# Patient Record
Sex: Female | Born: 1995 | Race: Black or African American | Hispanic: No | Marital: Single | Smoking: Never smoker
Health system: Southern US, Community
[De-identification: ages and names within clinical notes are randomized; demographics above are authoritative.]

## PROBLEM LIST (undated history)

## (undated) DIAGNOSIS — I10 Essential (primary) hypertension: Secondary | ICD-10-CM

## (undated) DIAGNOSIS — B9689 Other specified bacterial agents as the cause of diseases classified elsewhere: Secondary | ICD-10-CM

## (undated) DIAGNOSIS — B379 Candidiasis, unspecified: Secondary | ICD-10-CM

---
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
## 2017-02-15 ENCOUNTER — Emergency Department (HOSPITAL_COMMUNITY): Payer: Self-pay

## 2017-02-15 ENCOUNTER — Other Ambulatory Visit: Payer: Self-pay

## 2017-02-15 ENCOUNTER — Emergency Department (HOSPITAL_COMMUNITY)
Admission: EM | Admit: 2017-02-15 | Discharge: 2017-02-16 | Disposition: A | Discharge status: Home / Self Care | Payer: Self-pay | Attending: Emergency Medicine | Admitting: Emergency Medicine

## 2017-02-15 ENCOUNTER — Encounter (HOSPITAL_COMMUNITY): Payer: Self-pay

## 2017-02-15 DIAGNOSIS — Y9389 Activity, other specified: Secondary | ICD-10-CM | POA: Insufficient documentation

## 2017-02-15 DIAGNOSIS — Y999 Unspecified external cause status: Secondary | ICD-10-CM | POA: Insufficient documentation

## 2017-02-15 DIAGNOSIS — Y929 Unspecified place or not applicable: Secondary | ICD-10-CM | POA: Insufficient documentation

## 2017-02-15 DIAGNOSIS — S52501A Unspecified fracture of the lower end of right radius, initial encounter for closed fracture: Secondary | ICD-10-CM | POA: Insufficient documentation

## 2017-02-15 DIAGNOSIS — I10 Essential (primary) hypertension: Secondary | ICD-10-CM | POA: Insufficient documentation

## 2017-02-15 DIAGNOSIS — S52601A Unspecified fracture of lower end of right ulna, initial encounter for closed fracture: Secondary | ICD-10-CM

## 2017-02-15 HISTORY — DX: Essential (primary) hypertension: I10

## 2017-02-15 MED ORDER — LIDOCAINE HCL 1 % IJ SOLN
20.0000 mL | Freq: Once | INTRAMUSCULAR | Status: AC
  Administered 2017-02-16: 20 mL
  Filled 2017-02-15: qty 20

## 2017-02-15 NOTE — ED Notes (Signed)
Transported to xray 

## 2017-02-15 NOTE — ED Notes (Signed)
EMS splint intact to right forearm area
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
## 2017-02-16 ENCOUNTER — Emergency Department (HOSPITAL_COMMUNITY): Payer: Self-pay

## 2017-02-16 MED ORDER — OXYCODONE HCL 5 MG PO TABS: 5.0000 mg | ORAL_TABLET | Freq: Four times a day (QID) | ORAL | 0 refills | Status: DC | PRN

## 2017-02-16 MED ORDER — HYDROCODONE-ACETAMINOPHEN 5-325 MG PO TABS: 1.0000 | ORAL_TABLET | Freq: Four times a day (QID) | ORAL | 0 refills | Status: DC | PRN

## 2017-02-16 MED ORDER — ACETAMINOPHEN 325 MG PO TABS: 650.0000 mg | ORAL_TABLET | Freq: Four times a day (QID) | ORAL | Status: DC | PRN

## 2017-02-16 MED ORDER — IBUPROFEN 200 MG PO TABS: 600.0000 mg | ORAL_TABLET | Freq: Four times a day (QID) | ORAL | Status: DC | PRN
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
## 2017-02-16 NOTE — Progress Notes (Signed)
Orthopedic Tech Progress Note Patient Details:  Jane Curry [birth year] [id number]  Ortho Devices Type of Ortho Device: Ace wrap, Sugartong splint, Arm sling Ortho Device/Splint Location: RUE Ortho Device/Splint Interventions: Ordered, Application   Post Interventions Patient Tolerated: Well Instructions Provided: Care of device   Jennye MoccasinHughes, Jane Curry 02/16/2017, 12:40 AM

## 2017-11-24 ENCOUNTER — Ambulatory Visit (HOSPITAL_COMMUNITY)
Admission: EM | Admit: 2017-11-24 | Discharge: 2017-11-24 | Disposition: A | Discharge status: Home / Self Care | Payer: BLUE CROSS/BLUE SHIELD | Attending: Emergency Medicine | Admitting: Emergency Medicine

## 2017-11-24 ENCOUNTER — Encounter (HOSPITAL_COMMUNITY): Payer: Self-pay

## 2017-11-24 DIAGNOSIS — W57XXXA Bitten or stung by nonvenomous insect and other nonvenomous arthropods, initial encounter: Secondary | ICD-10-CM

## 2017-11-24 DIAGNOSIS — S50861A Insect bite (nonvenomous) of right forearm, initial encounter: Secondary | ICD-10-CM

## 2017-11-24 DIAGNOSIS — M62830 Muscle spasm of back: Secondary | ICD-10-CM

## 2017-11-24 DIAGNOSIS — L270 Generalized skin eruption due to drugs and medicaments taken internally: Secondary | ICD-10-CM | POA: Diagnosis not present

## 2017-11-24 MED ORDER — NAPROXEN 375 MG PO TABS: 375.0000 mg | ORAL_TABLET | Freq: Two times a day (BID) | ORAL | 0 refills | Status: DC

## 2017-11-24 MED ORDER — TRIAMCINOLONE ACETONIDE 0.025 % EX OINT: 1.0000 "application " | TOPICAL_OINTMENT | Freq: Two times a day (BID) | CUTANEOUS | 0 refills | Status: AC

## 2017-11-24 MED ORDER — CYCLOBENZAPRINE HCL 10 MG PO TABS: 10.0000 mg | ORAL_TABLET | Freq: Every day | ORAL | 0 refills | Status: DC

## 2017-11-24 NOTE — Discharge Instructions (Addendum)
Insect bite:  Triamcinolone 0.025% cream prescribed Use as directed for symptomatic relief Return or go to the ER if you have any new or worsening symptoms such as pain, increased redness, fever, chills, nausea, vomiting, joint pain, body aches, abdominal pain, etc...  Back Pain:  Continue conservative management of rest, ice, and gentle stretches Take naproxen as needed for pain relief (may cause abdominal discomfort, ulcers, and GI bleeds avoid taking with other NSAIDs) Take cyclobenzaprine at nighttime for symptomatic relief. Avoid driving or operating heavy machinery while using medication. PCP assistance initiated  Follow up with PCP or with [facility] for further evaluation and management of chronic back pain Present to ER if worsening or new symptoms (fever, chills, chest pain, abdominal pain, changes in bowel or bladder habits, pain radiating into lower legs, etc...)

## 2017-11-24 NOTE — ED Provider Notes (Signed)
[facility]   [id number] 11/24/17 Arrival Time: 1254  CC: Bug bite and back pain  SUBJECTIVE:  Jane Curry is a 22 y.o. female who presents with an insect bite to right forearm that began 2 months ago, but yesterday noticed redness around site.  Denies a precipitating event, but does admit to increasing ibuprofen use recently.  Unsure what type of insect bit her.  Localizes the bite to right forearm.  Describes it as itchy.  Has NOT tried OTC medications.  Symptoms are made worse with friciton.  Denies similar symptoms in the past.  Complains of bruising and erythema. Denies fever, chills, nausea, vomiting, joint pain, SOB, chest pain, abdominal pain, changes in bowel or bladder function.    Patient also incidentally mentions back pain that occurred a year ago.  Denies a precipitating event, but admits to lifting boxes at her previous employer around the time of her symptoms.  Localizes back pain to right low back.  Describes as intermittent, but more persistent recently.  Has tried going to chiropractor and using ibuprofen with minimal relief.  Worse with ROM.  Denies fever, chills, nausea, vomiting, redness, swelling, ecchymosis, numbness, tingling, loss of bowel or bladder function.    ROS: As per HPI.  Past Medical History:  Diagnosis Date  . Hypertension    History reviewed. No pertinent surgical history. No Known Allergies No current facility-administered medications on file prior to encounter.    Current Outpatient Medications on File Prior to Encounter  Medication Sig Dispense Refill  . norethindrone-ethinyl estradiol (JUNEL FE,GILDESS FE,LOESTRIN FE) 1-20 MG-MCG tablet Take 1 tablet by mouth daily.     Social History   Socioeconomic History  . Marital status: Single    Spouse name: Not on file  . Number of children: Not on file  . Years of education: Not on file  . Highest education level: Not on file  Occupational History  . Not on file  Social Needs  .  Financial resource strain: Not on file  . Food insecurity:    Worry: Not on file    Inability: Not on file  . Transportation needs:    Medical: Not on file    Non-medical: Not on file  Tobacco Use  . Smoking status: Never Smoker  . Smokeless tobacco: Never Used  Substance and Sexual Activity  . Alcohol use: No    Frequency: Never  . Drug use: No  . Sexual activity: Not on file  Lifestyle  . Physical activity:    Days per week: Not on file    Minutes per session: Not on file  . Stress: Not on file  Relationships  . Social connections:    Talks on phone: Not on file    Gets together: Not on file    Attends religious service: Not on file    Active member of club or organization: Not on file    Attends meetings of clubs or organizations: Not on file    Relationship status: Not on file  . Intimate partner violence:    Fear of current or ex partner: Not on file    Emotionally abused: Not on file    Physically abused: Not on file    Forced sexual activity: Not on file  Other Topics Concern  . Not on file  Social History Narrative  . Not on file   History reviewed. No pertinent family history.  OBJECTIVE: Vitals:   11/24/17 1311  BP: (!) 147/77  Pulse: 82  Resp: 18  Temp: 98.6 F (37 C)  TempSrc: Oral  SpO2: 100%    General appearance: alert; no distress Lungs: clear to auscultation bilaterally Heart: regular rate and rhythm.  Radial pulse 2+ bilaterally Extremities: no edema MSK: Back  Inspection: Skin clear and intact without obvious erythema, effusion, or ecchymosis.  Skin warm and dry to the touch  Palpation: No midline tenderness, tender to palpation about the right lower back  ROM: FROM active and passive  Strength: 5/5 shld abduction, 5/5 shld adduction, 5/5 elbow flexion, 5/5 elbow extension, 5/5 grip strength, 5/5 hip flexion, 5/5 knee abduction, 5/5 knee adduction, 5/5 knee flexion, 5/5 knee extension, 5/5 dorsiflexion, 5/5 plantar flexion  Sensation  intact about the upper and lower extremities  Skin: warm and dry; ecchymosis approximately 2 cm in diameter with 1 cm surrounding flat erythema localized to right anterior forearm; nontender to palpation; no obvious drainage; ecchymosis does not blanche with pressure Psychological: alert and cooperative; normal mood and affect        ASSESSMENT & PLAN:  1. Drug eruption   2. Back spasm   3. Insect bite of right forearm, initial encounter     Meds ordered this encounter  Medications  . naproxen (NAPROSYN) 375 MG tablet    Sig: Take 1 tablet (375 mg total) by mouth 2 (two) times daily.    Dispense:  20 tablet    Refill:  0    Order Specific Question:   Supervising Provider    Answer:   Isa Rankin [phone]  . cyclobenzaprine (FLEXERIL) 10 MG tablet    Sig: Take 1 tablet (10 mg total) by mouth at bedtime.    Dispense:  15 tablet    Refill:  0    Order Specific Question:   Supervising Provider    Answer:   Isa Rankin [phone]  . triamcinolone (KENALOG) 0.025 % ointment    Sig: Apply 1 application topically 2 (two) times daily.    Dispense:  30 g    Refill:  0    Order Specific Question:   Supervising Provider    Answer:   Isa Rankin [[id number]]   Insect bite:  Triamcinolone 0.025% cream prescribed Use as directed for symptomatic relief Return or go to the ER if you have any new or worsening symptoms such as pain, increased redness, fever, chills, nausea, vomiting, joint pain, body aches, abdominal pain, etc...  Back Pain:  Continue conservative management of rest, ice, and gentle stretches Take naproxen as needed for pain relief (may cause abdominal discomfort, ulcers, and GI bleeds avoid taking with other NSAIDs) Take cyclobenzaprine at nighttime for symptomatic relief. Avoid driving or operating heavy machinery while using medication. PCP assistance initiated  Follow up with PCP or with [facility] and wellness for further evaluation and  management of chronic back pain Present to ER if worsening or new symptoms (fever, chills, chest pain, abdominal pain, changes in bowel or bladder habits, pain radiating into lower legs, etc...)  Reviewed expectations re: course of current medical issues. Questions answered. Outlined signs and symptoms indicating need for more acute intervention. Patient verbalized understanding. After Visit Summary given.   Rennis Harding, PA-C 11/24/17 1409

## 2017-11-24 NOTE — ED Triage Notes (Signed)
Pt presents with unknown insect bite to right forearm.

## 2018-05-04 ENCOUNTER — Ambulatory Visit (HOSPITAL_COMMUNITY)
Admission: EM | Admit: 2018-05-04 | Discharge: 2018-05-04 | Disposition: A | Discharge status: Home / Self Care | Payer: BLUE CROSS/BLUE SHIELD | Attending: Family Medicine | Admitting: Family Medicine

## 2018-05-04 ENCOUNTER — Encounter (HOSPITAL_COMMUNITY): Payer: Self-pay | Admitting: Emergency Medicine

## 2018-05-04 DIAGNOSIS — M5441 Lumbago with sciatica, right side: Secondary | ICD-10-CM

## 2018-05-04 MED ORDER — KETOROLAC TROMETHAMINE 60 MG/2ML IM SOLN
INTRAMUSCULAR | Status: AC
  Filled 2018-05-04: qty 2

## 2018-05-04 MED ORDER — MELOXICAM 7.5 MG PO TABS: 7.5000 mg | ORAL_TABLET | Freq: Every day | ORAL | 0 refills | Status: AC

## 2018-05-04 MED ORDER — KETOROLAC TROMETHAMINE 60 MG/2ML IM SOLN: 60.0000 mg | Freq: Once | INTRAMUSCULAR | Status: DC

## 2018-05-04 MED ORDER — CYCLOBENZAPRINE HCL 5 MG PO TABS: 5.0000 mg | ORAL_TABLET | Freq: Every day | ORAL | 0 refills | Status: AC

## 2018-05-04 NOTE — Discharge Instructions (Signed)
Don't take additional ibuprofen for another 8 hours.  Start meloxicam tomorrow. Once taking don't take additional ibuprofen.  Muscle relaxer at night as needed. May cause drowsiness. Please do not take if driving or drinking alcohol.   Light and regular activity as tolerated. See exercises provided, strengthening of back as able once pain improves.  Continue to follow with your primary care provider as needed for persistent symptoms.

## 2018-05-04 NOTE — ED Notes (Signed)
Patient able to ambulate independently  

## 2018-05-04 NOTE — ED Provider Notes (Signed)
[facility]    CSN: [id number] Arrival date & time: 05/04/18  1029     History   Chief Complaint Chief Complaint  Patient presents with  . Back Pain    HPI Jane Curry is a 23 y.o. female.   Jane Curry presents with complaints of right sided low back pain. Started two days ago- she jumped off of a conveyor belt onto the ground, approximately 2 ft off the ground, landing on her feet. Had immediate pain to right low back. Has had previous similar in the past. Radiates down right thigh. Certain positions and activity worsen the pain. Denies urinary symptoms or loss of bladder or bowel function. Pain 7/10. Took ibuprofen this morning which did help some. Works at [employer]. Denies numbness tingling or weakness of the lower extremities. Without contributing medical history.      ROS per HPI.      Past Medical History:  Diagnosis Date  . Hypertension     There are no active problems to display for this patient.   History reviewed. No pertinent surgical history.  OB History   No obstetric history on file.      Home Medications    Prior to Admission medications   Medication Sig Start Date End Date Taking? Authorizing Provider  cyclobenzaprine (FLEXERIL) 5 MG tablet Take 1 tablet (5 mg total) by mouth at bedtime. 05/04/18   Georgetta Haber, NP  meloxicam (MOBIC) 7.5 MG tablet Take 1 tablet (7.5 mg total) by mouth daily. 05/04/18   Georgetta Haber, NP  norethindrone-ethinyl estradiol (JUNEL FE,GILDESS FE,LOESTRIN FE) 1-20 MG-MCG tablet Take 1 tablet by mouth daily.    [provider]  triamcinolone (KENALOG) 0.025 % ointment Apply 1 application topically 2 (two) times daily. 11/24/17   Rennis Harding, PA-C    Family History History reviewed. No pertinent family history.  Social History Social History   Tobacco Use  . Smoking status: Never Smoker  . Smokeless tobacco: Never Used  Substance Use Topics  . Alcohol use: No    Frequency: Never    . Drug use: No     Allergies   Patient has no known allergies.   Review of Systems Review of Systems   Physical Exam Triage Vital Signs ED Triage Vitals [05/04/18 1051]  Enc Vitals Group     BP 132/71     Pulse Rate 72     Resp 18     Temp 98.1 F (36.7 C)     Temp Source Temporal     SpO2 100 %     Weight      Height      Head Circumference      Peak Flow      Pain Score 10     Pain Loc      Pain Edu?      Excl. in GC?    No data found.  Updated Vital Signs BP 132/71 (BP Location: Right Arm)   Pulse 72   Temp 98.1 F (36.7 C) (Temporal)   Resp 18   SpO2 100%   Visual Acuity Right Eye Distance:   Left Eye Distance:   Bilateral Distance:    Right Eye Near:   Left Eye Near:    Bilateral Near:     Physical Exam Constitutional:      General: She is not in acute distress.    Appearance: She is well-developed.  Cardiovascular:     Rate and Rhythm: Normal  rate and regular rhythm.     Heart sounds: Normal heart sounds.  Pulmonary:     Effort: Pulmonary effort is normal.     Breath sounds: Normal breath sounds.  Musculoskeletal:     Lumbar back: She exhibits tenderness and pain. She exhibits no bony tenderness, no swelling, no edema, no deformity, no laceration, no spasm and normal pulse.     Comments: Right low back with muscular tenderness on palpation; pain to right low back with bilateral hip flexion and straight leg raise; strength equal bilaterally; gross sensation intact; ambulatory without difficulty  Skin:    General: Skin is warm and dry.  Neurological:     Mental Status: She is alert and oriented to person, place, and time.      UC Treatments / Results  Labs (all labs ordered are listed, but only abnormal results are displayed) Labs Reviewed - No data to display  EKG None  Radiology No results found.  Procedures Procedures (including critical care time)  Medications Ordered in UC Medications  ketorolac (TORADOL) injection 60  mg (has no administration in time range)    Initial Impression / Assessment and Plan / UC Course  I have reviewed the triage vital signs and the nursing notes.  Pertinent labs & imaging results that were available during my care of the patient were reviewed by me and considered in my medical decision making (see chart for details).     Right low back pain and sciatica. No red flag findings. Antiinflammatory, muscle relaxers provided. Limit heavy lifting x 3 days. Follow up with pcp as needed for persistent symptoms. Patient verbalized understanding and agreeable to plan.  Ambulatory out of clinic without difficulty.    Final Clinical Impressions(s) / UC Diagnoses   Final diagnoses:  Acute right-sided low back pain with right-sided sciatica     Discharge Instructions     Don't take additional ibuprofen for another 8 hours.  Start meloxicam tomorrow. Once taking don't take additional ibuprofen.  Muscle relaxer at night as needed. May cause drowsiness. Please do not take if driving or drinking alcohol.   Light and regular activity as tolerated. See exercises provided, strengthening of back as able once pain improves.  Continue to follow with your primary care provider as needed for persistent symptoms.    ED Prescriptions    Medication Sig Dispense Auth. Provider   meloxicam (MOBIC) 7.5 MG tablet Take 1 tablet (7.5 mg total) by mouth daily. 20 tablet Linus Mako B, NP   cyclobenzaprine (FLEXERIL) 5 MG tablet Take 1 tablet (5 mg total) by mouth at bedtime. 15 tablet Georgetta Haber, NP     Controlled Substance Prescriptions [place] Controlled Substance Registry consulted? Not Applicable   Georgetta Haber, NP 05/04/18 1137

## 2018-05-04 NOTE — ED Triage Notes (Signed)
Pt sts lower back pain worse on right side after landing wrong when jumping off conveyor belt
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
## 2019-08-01 IMAGING — CR DG WRIST COMPLETE 3+V*R*
4 series · 4 of 4 positions shown · non-contrast
Comparison: 02/15/2017

CLINICAL DATA: Post reduction.

EXAM:
RIGHT WRIST - COMPLETE 3+ VIEW

[x wrist pa right]
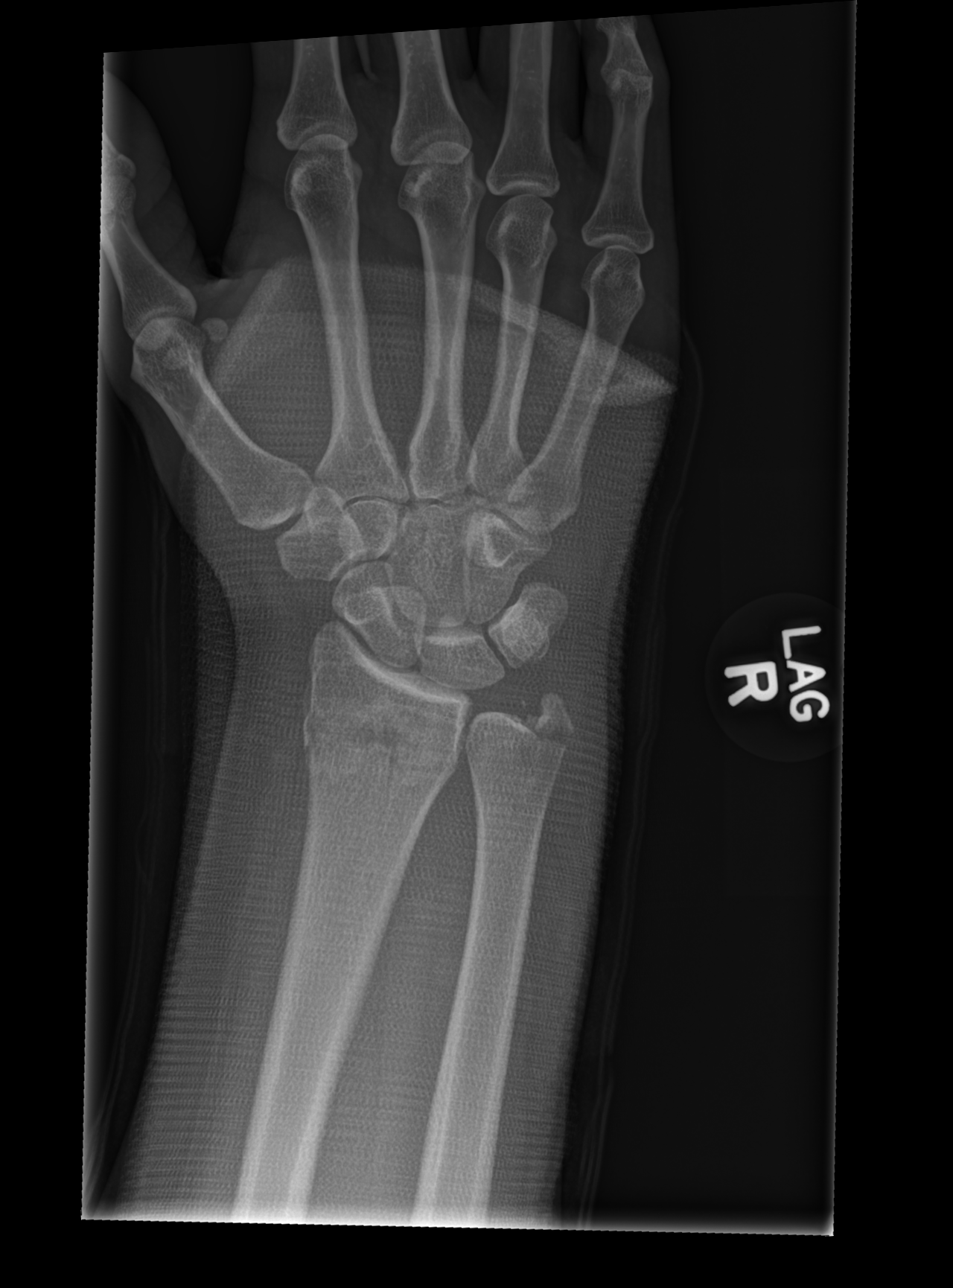

[x wrist obl right (1 of 2)]
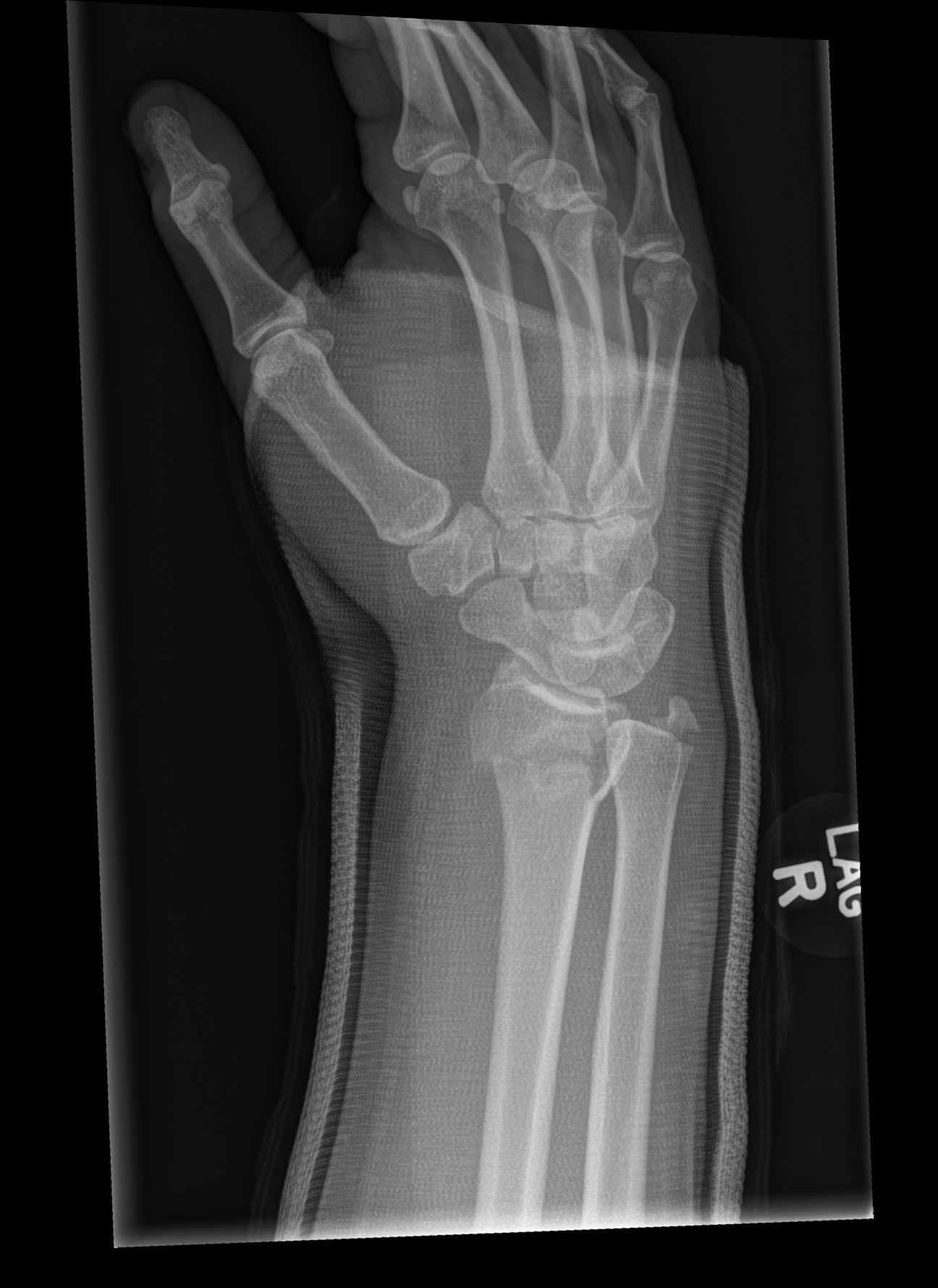

[x wrist obl right (2 of 2)]
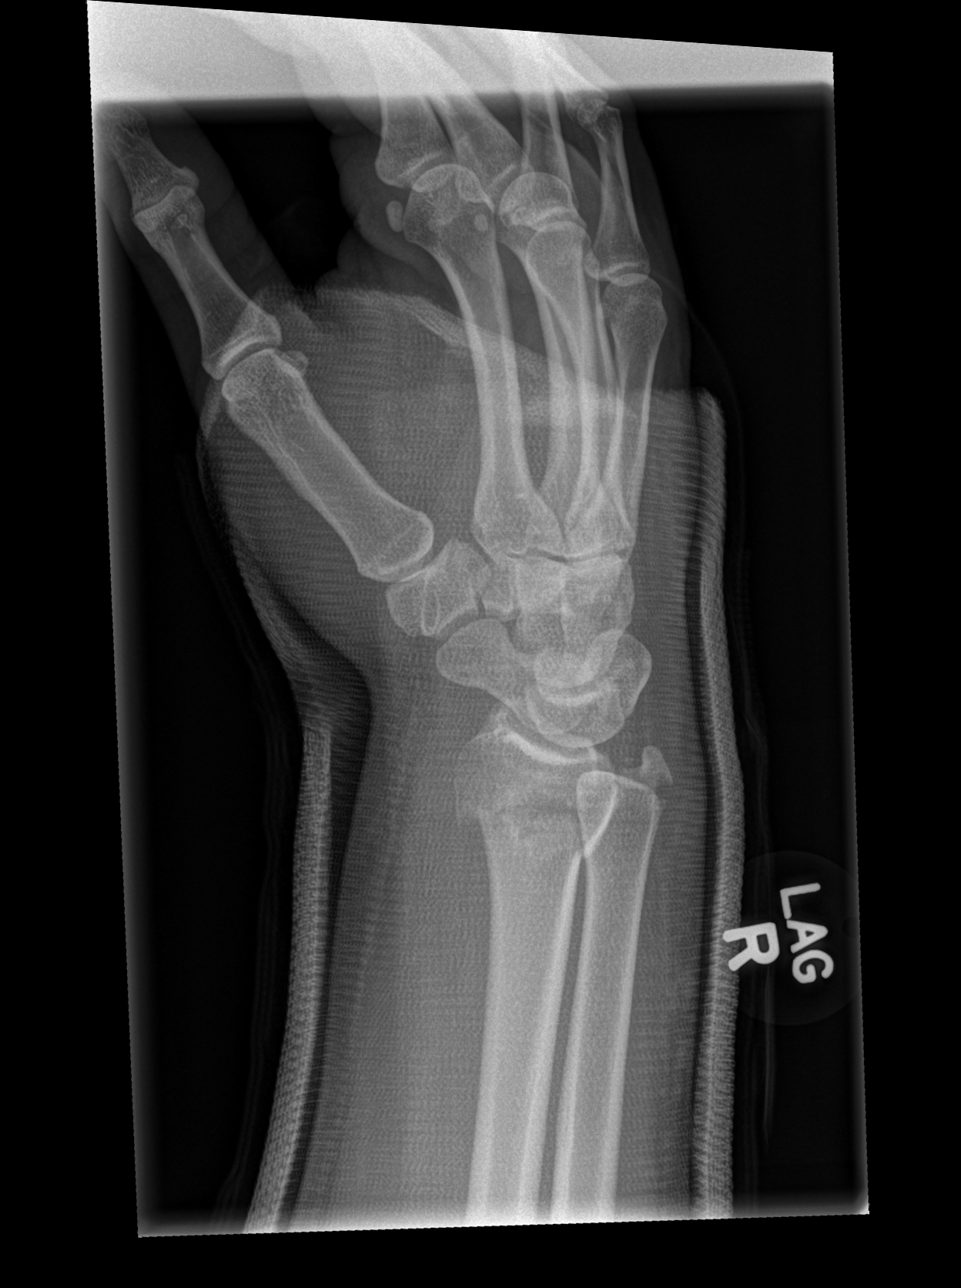

[x wrist lat right]
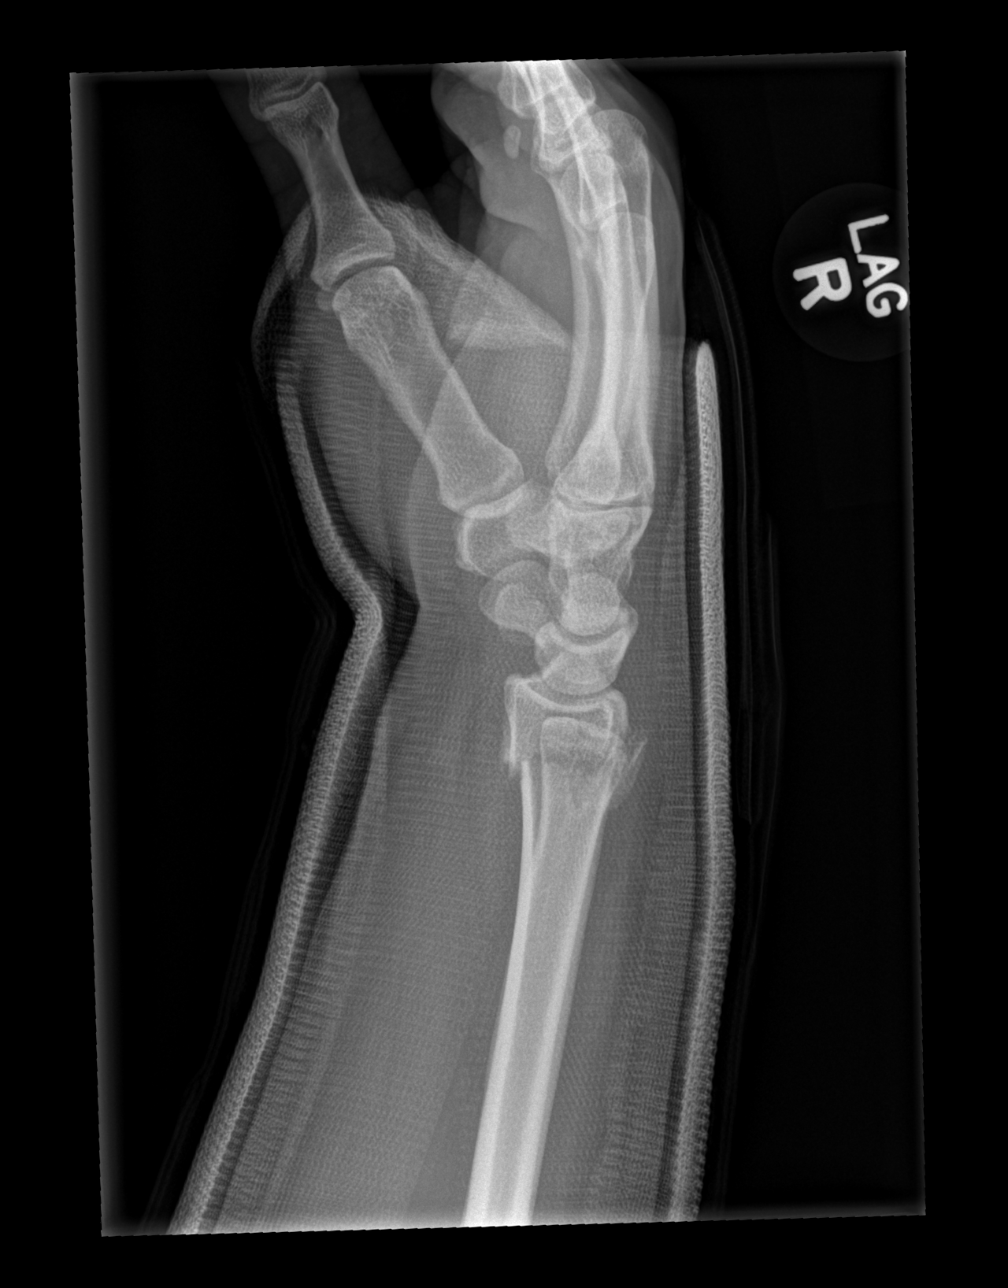

[4 of 4 positions shown; findings below may reference images not displayed]

FINDINGS: Post reduction of comminuted displaced fracture of the distal radius
with improved alignment, no angulation and minimal palmar
displacement of the distal fracture fragment. Associated ulnar
styloid process fracture is again seen.
IMPRESSION: Improved alignment of the comminuted distal radial fracture, post
reduction.

## 2020-10-06 ENCOUNTER — Ambulatory Visit
Admission: EM | Admit: 2020-10-06 | Discharge: 2020-10-06 | Disposition: A | Discharge status: Home / Self Care | Payer: BC Managed Care – PPO | Attending: Family Medicine | Admitting: Family Medicine

## 2020-10-06 ENCOUNTER — Encounter: Payer: Self-pay | Admitting: Emergency Medicine

## 2020-10-06 ENCOUNTER — Other Ambulatory Visit: Payer: Self-pay

## 2020-10-06 DIAGNOSIS — B349 Viral infection, unspecified: Secondary | ICD-10-CM

## 2020-10-06 DIAGNOSIS — H10533 Contact blepharoconjunctivitis, bilateral: Secondary | ICD-10-CM

## 2020-10-06 MED ORDER — PREDNISONE 20 MG PO TABS: 40.0000 mg | ORAL_TABLET | Freq: Every day | ORAL | 0 refills | Status: AC

## 2020-10-06 MED ORDER — ERYTHROMYCIN 5 MG/GM OP OINT: TOPICAL_OINTMENT | Freq: Once | OPHTHALMIC | Status: AC

## 2020-10-06 NOTE — ED Provider Notes (Signed)
[facility]    CSN: [id number] Arrival date & time: 10/06/20  1046      History   Chief Complaint Chief Complaint  Patient presents with   Eye Problem    HPI Jane Curry is a 25 y.o. female.   HPI  Patient presents today with bilateral eyelid swelling, eye redness and drainage.  Patient reports redness of eyes and irritation developed 3 days ago along with nasal congestion.  She suspected symptoms were related to allergies and would resolve without intervention.  Upon awakening today her eyes were crusted over and have persistently with clear, purulent discharge.  She continues to have nasal congestion and low grade fever on arrival.  Past Medical History:  Diagnosis Date   Hypertension     There are no problems to display for this patient.   History reviewed. No pertinent surgical history.  OB History   No obstetric history on file.      Home Medications    Prior to Admission medications   Medication Sig Start Date End Date Taking? Authorizing Provider  norethindrone-ethinyl estradiol (JUNEL FE,GILDESS FE,LOESTRIN FE) 1-20 MG-MCG tablet Take 1 tablet by mouth daily.   Yes [provider]  predniSONE (DELTASONE) 20 MG tablet Take 2 tablets (40 mg total) by mouth daily with breakfast. 10/06/20  Yes Bing Neighbors, FNP  cyclobenzaprine (FLEXERIL) 5 MG tablet Take 1 tablet (5 mg total) by mouth at bedtime. 05/04/18   Georgetta Haber, NP  meloxicam (MOBIC) 7.5 MG tablet Take 1 tablet (7.5 mg total) by mouth daily. 05/04/18   Linus Mako B, NP  triamcinolone (KENALOG) 0.025 % ointment Apply 1 application topically 2 (two) times daily. 11/24/17   Rennis Harding, PA-C    Family History History reviewed. No pertinent family history.  Social History Social History   Tobacco Use   Smoking status: Never   Smokeless tobacco: Never  Substance Use Topics   Alcohol use: No   Drug use: No     Allergies   Patient has no known  allergies.   Review of Systems Review of Systems Pertinent negatives listed in HPI  Physical Exam Triage Vital Signs ED Triage Vitals  Enc Vitals Group     BP 10/06/20 1102 (!) 147/93     Pulse Rate 10/06/20 1102 79     Resp 10/06/20 1102 18     Temp 10/06/20 1102 99 F (37.2 C)     Temp Source 10/06/20 1102 Oral     SpO2 10/06/20 1102 97 %     Weight --      Height --      Head Circumference --      Peak Flow --      Pain Score 10/06/20 1107 3     Pain Loc --      Pain Edu? --      Excl. in GC? --    No data found.  Updated Vital Signs BP (!) 147/93 (BP Location: Left Arm)   Pulse 79   Temp 99 F (37.2 C) (Oral)   Resp 18   LMP 09/22/2020 (Approximate)   SpO2 97%   Visual Acuity Right Eye Distance:   Left Eye Distance:   Bilateral Distance:    Right Eye Near:   Left Eye Near:    Bilateral Near:     Physical Exam Constitutional:      Appearance: Normal appearance.  HENT:     Head: Normocephalic and atraumatic.  Nose: Congestion and rhinorrhea present.     Mouth/Throat:     Mouth: Mucous membranes are moist.     Pharynx: No oropharyngeal exudate.  Eyes:     General:        Right eye: Discharge present.        Left eye: Discharge present.    Comments: Upper and lower eye lid swelling   Cardiovascular:     Rate and Rhythm: Normal rate and regular rhythm.  Pulmonary:     Effort: Pulmonary effort is normal.     Breath sounds: Normal breath sounds.  Musculoskeletal:        General: Normal range of motion.  Skin:    General: Skin is warm.     Capillary Refill: Capillary refill takes less than 2 seconds.  Neurological:     General: No focal deficit present.     Mental Status: She is alert and oriented to person, place, and time.     UC Treatments / Results  Labs (all labs ordered are listed, but only abnormal results are displayed) Labs Reviewed  COVID-19, FLU A+B NAA    EKG   Radiology No results found.  Procedures Procedures  (including critical care time)  Medications Ordered in UC Medications  erythromycin ophthalmic ointment ( Both Eyes Given 10/06/20 1148)    Initial Impression / Assessment and Plan / UC Course  I have reviewed the triage vital signs and the nursing notes.  Pertinent labs & imaging results that were available during my care of the patient were reviewed by me and considered in my medical decision making (see chart for details).    Contact blepharoconjunctivitis of both eye and viral illness.COVID/Flu test pending. Symptom management warranted only.  Manage fever with Tylenol and ibuprofen.  Nasal symptoms with over-the-counter antihistamines recommended.  Treatment per discharge medications/discharge instructions.  Red flags/ER precautions given. The most current CDC isolation/quarantine recommendation advised.   Final Clinical Impressions(s) / UC Diagnoses   Final diagnoses:  Contact blepharoconjunctivitis of both eyes  Viral illness   Discharge Instructions   None    ED Prescriptions     Medication Sig Dispense Auth. Provider   predniSONE (DELTASONE) 20 MG tablet Take 2 tablets (40 mg total) by mouth daily with breakfast. 10 tablet Bing Neighbors, FNP      PDMP not reviewed this encounter.   Bing Neighbors, FNP 10/06/20 1326

## 2020-10-06 NOTE — ED Triage Notes (Signed)
Patient c/o bilateral eye irritation x 2 days.   Patient endorses redness, swelling, and "crusting of eyes this morning".   Patient denies vision changes.   Patient endorses sinus pressure and nasal congestion.   Patient endorses "green mucus" drainage.   Patient denies any recent illness or infections.   Patient hasn't used any medications for symptoms.

## 2020-10-07 LAB — COVID-19, FLU A+B NAA
Influenza A, NAA: NOT DETECTED
Influenza B, NAA: NOT DETECTED
SARS-CoV-2, NAA: NOT DETECTED

## 2020-12-23 ENCOUNTER — Ambulatory Visit: Admit: 2020-12-23 | Payer: BC Managed Care – PPO

## 2020-12-25 ENCOUNTER — Other Ambulatory Visit: Payer: Self-pay

## 2020-12-25 ENCOUNTER — Ambulatory Visit
Admission: RE | Admit: 2020-12-25 | Discharge: 2020-12-25 | Disposition: A | Discharge status: Home / Self Care | Payer: BC Managed Care – PPO | Source: Ambulatory Visit | Attending: Emergency Medicine | Admitting: Emergency Medicine

## 2020-12-25 ENCOUNTER — Ambulatory Visit: Payer: BC Managed Care – PPO

## 2020-12-25 VITALS — BP 118/79 | HR 79 | Temp 97.7°F | Resp 16

## 2020-12-25 DIAGNOSIS — B9689 Other specified bacterial agents as the cause of diseases classified elsewhere: Secondary | ICD-10-CM | POA: Insufficient documentation

## 2020-12-25 DIAGNOSIS — N760 Acute vaginitis: Secondary | ICD-10-CM | POA: Diagnosis present

## 2020-12-25 HISTORY — DX: Other specified bacterial agents as the cause of diseases classified elsewhere: B96.89

## 2020-12-25 HISTORY — DX: Candidiasis, unspecified: B37.9

## 2020-12-25 LAB — POCT URINALYSIS DIP (MANUAL ENTRY)
Bilirubin, UA: NEGATIVE
Glucose, UA: NEGATIVE mg/dL
Ketones, POC UA: NEGATIVE mg/dL
Leukocytes, UA: NEGATIVE
Nitrite, UA: NEGATIVE
Protein Ur, POC: NEGATIVE mg/dL
Spec Grav, UA: 1.025 (ref 1.010–1.025)
Urobilinogen, UA: 0.2 E.U./dL
pH, UA: 7 (ref 5.0–8.0)

## 2020-12-25 LAB — POCT URINE PREGNANCY: Preg Test, Ur: NEGATIVE

## 2020-12-25 MED ORDER — METRONIDAZOLE 500 MG PO TABS: 500.0000 mg | ORAL_TABLET | Freq: Two times a day (BID) | ORAL | 0 refills | Status: AC

## 2020-12-25 NOTE — Discharge Instructions (Signed)
Your testing has been sent to the lab.  If your testing results as positive, you will be notified via phone and we will initiate treatment at that time.  If you do not receive a phone call from us within the next 2 to 3 days, check your MyChart for updated health information. Do not engage in sex until you know your results. If you test positive you will need to abstain from sex for 7 days and notify all partners.  Bacterial vaginosis (BV) is caused by an change in the vaginal bacteria, therefore causing vaginal an off-white, thin, and "fishy smelling" discharge that is more noticeable after sexual intercourse and during menses. Take Flagyl (metronidazole) as directed (do not drink any alcohol or engage in sexual intercourse while taking this). Avoid scented soaps. Wear cotton underwear and void after sexual intercourse. Do not have sexual intercourse for one week.  If symptoms become recurrent, please follow up with a gynecologist.   
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
## 2020-12-25 NOTE — ED Provider Notes (Signed)
SUBJECTIVE:  Jane Curry is a very pleasant 25 y.o. Female presents with concern for STD due to recent unfaithful partner.  Patient reports that she has been having vaginal discharge with odor for about 1 week.  Patient reports a history of BV and yeast infections.  No fever, abdominal pain or dysuria.  ROS: General/Constitutional: No fever, chills, or sweats GI: No abdominal pain, nausea/vomiting or diarrhea GU: No urinary frequency or dysuria Genitalia: As above Lymph: No swelling, red streaks or swollen lymph nodes Skin: No rashes or skin lesions   OBJECTIVE: Vitals:   12/25/20 0931  BP: 118/79  Pulse: 79  Resp: 16  Temp: 97.7 F (36.5 C)  SpO2: 97%     General: Appears well-developed and well-nourished. No acute distress.  Cardiovascular: Normal rate Pulm/Chest: No respiratory distress Neurological: Alert and oriented to person, place, and time.  Skin: Skin is warm and dry.  Psychiatric: Normal mood, affect, behavior, and thought content.  GU:  Deferred secondary to self collect specimen   Laboratory:  Orders Placed This Encounter  Procedures   POCT urinalysis dipstick   POCT urine pregnancy   Results for orders placed or performed during the hospital encounter of 12/25/20  POCT urinalysis dipstick  Result Value Ref Range   Color, UA yellow yellow   Clarity, UA clear clear   Glucose, UA negative negative mg/dL   Bilirubin, UA negative negative   Ketones, POC UA negative negative mg/dL   Spec Grav, UA 1.093 2.355 - 1.025   Blood, UA trace-intact (A) negative   pH, UA 7.0 5.0 - 8.0   Protein Ur, POC negative negative mg/dL   Urobilinogen, UA 0.2 0.2 or 1.0 E.U./dL   Nitrite, UA Negative Negative   Leukocytes, UA Negative Negative  POCT urine pregnancy  Result Value Ref Range   Preg Test, Ur Negative Negative     Assessment: 1. Bacterial vaginosis - Cervicovaginal ancillary only; Standing - Cervicovaginal ancillary only - metroNIDAZOLE (FLAGYL) 500 MG  tablet; Take 1 tablet (500 mg total) by mouth 2 (two) times daily.  Dispense: 14 tablet; Refill: 0  Plan:  If new medications were prescribed and/or administered during this visit Instructions about new medications and side effects provided.  If any changes in chronic medications then new reconciled medication list is given to patient.    MDM: Patient presents with concern for STD due to recent unfaithful partner.  Patient reports that she has been having vaginal discharge with odor for about 1 week.  Patient reports a history of BV and yeast infections.  No fever, abdominal pain or dysuria.  Urinalysis reveals trace blood, otherwise normal.  hCG negative.  APTIMA swab pending.  Given symptoms, likely BV.  Rx Flagyl to the patient's preferred pharmacy and advised about home treatment and care to include wearing cotton underwear and voiding after sexual intercourse.  Advised that if symptoms become recurrent she should follow-up with her gynecology team.  Also advised that we would call her with any positive results that require treatment from her APTIMA swab obtained in clinic today within about the next 2 or 3 days.  Patient verbalized understanding and agreed with plan.  Patient stable upon discharge.  Return as needed.  Instructions:    Discharge Instructions      Your testing has been sent to the lab.  If your testing results as positive, you will be notified via phone and we will initiate treatment at that time.  If you do not receive a  phone call from [place] within the next 2 to 3 days, check your MyChart for updated health information. Do not engage in sex until you know your results. If you test positive you will need to abstain from sex for 7 days and notify all partners.   Bacterial vaginosis (BV) is caused by an change in the vaginal bacteria, therefore causing vaginal an off-white, thin, and "fishy smelling" discharge that is more noticeable after sexual intercourse and during menses. Take  Flagyl (metronidazole) as directed (do not drink any alcohol or engage in sexual intercourse while taking this). Avoid scented soaps. Wear cotton underwear and void after sexual intercourse. Do not have sexual intercourse for one week.  If symptoms become recurrent, please follow up with a gynecologist.           Amalia Greenhouse, FNP 12/25/20 1008

## 2020-12-28 LAB — CERVICOVAGINAL ANCILLARY ONLY
Bacterial Vaginitis (gardnerella): POSITIVE — AB
Candida Glabrata: NEGATIVE
Candida Vaginitis: NEGATIVE
Chlamydia: NEGATIVE
Comment: NEGATIVE
Comment: NEGATIVE
Comment: NEGATIVE
Comment: NEGATIVE
Comment: NEGATIVE
Comment: NORMAL
Neisseria Gonorrhea: NEGATIVE
Trichomonas: NEGATIVE
